# Patient Record
Sex: Male | Born: 1980 | Race: White | Hispanic: No | Marital: Married | Smoking: Never smoker
Health system: Southern US, Community
[De-identification: ages and names within clinical notes are randomized; demographics above are authoritative.]

## PROBLEM LIST (undated history)

## (undated) HISTORY — PX: HERNIA REPAIR: SHX51

---

## 2005-05-20 ENCOUNTER — Emergency Department (HOSPITAL_COMMUNITY): Admission: EM | Admit: 2005-05-20 | Discharge: 2005-05-20 | Payer: Self-pay | Admitting: Emergency Medicine

## 2009-02-14 ENCOUNTER — Emergency Department (HOSPITAL_COMMUNITY): Admission: EM | Admit: 2009-02-14 | Discharge: 2009-02-14 | Payer: Self-pay | Admitting: Family Medicine

## 2009-12-21 ENCOUNTER — Emergency Department (HOSPITAL_COMMUNITY): Admission: EM | Admit: 2009-12-21 | Discharge: 2009-12-21 | Payer: Self-pay | Admitting: Family Medicine

## 2010-11-30 ENCOUNTER — Emergency Department (HOSPITAL_COMMUNITY)
Admission: EM | Admit: 2010-11-30 | Discharge: 2010-11-30 | Disposition: A | Discharge status: Home / Self Care | Payer: Worker's Compensation | Attending: Emergency Medicine | Admitting: Emergency Medicine

## 2010-11-30 ENCOUNTER — Emergency Department (HOSPITAL_COMMUNITY): Payer: Worker's Compensation

## 2010-11-30 DIAGNOSIS — IMO0002 Reserved for concepts with insufficient information to code with codable children: Secondary | ICD-10-CM | POA: Insufficient documentation

## 2010-11-30 DIAGNOSIS — X58XXXA Exposure to other specified factors, initial encounter: Secondary | ICD-10-CM | POA: Insufficient documentation

## 2010-11-30 DIAGNOSIS — M25569 Pain in unspecified knee: Secondary | ICD-10-CM | POA: Insufficient documentation

## 2010-11-30 DIAGNOSIS — Y9302 Activity, running: Secondary | ICD-10-CM | POA: Insufficient documentation

## 2011-09-18 ENCOUNTER — Emergency Department (HOSPITAL_COMMUNITY): Payer: Worker's Compensation

## 2011-09-18 ENCOUNTER — Encounter (HOSPITAL_COMMUNITY): Payer: Self-pay | Admitting: Emergency Medicine

## 2011-09-18 ENCOUNTER — Emergency Department (HOSPITAL_COMMUNITY)
Admission: EM | Admit: 2011-09-18 | Discharge: 2011-09-18 | Disposition: A | Discharge status: Home / Self Care | Payer: Worker's Compensation | Attending: Emergency Medicine | Admitting: Emergency Medicine

## 2011-09-18 DIAGNOSIS — S93409A Sprain of unspecified ligament of unspecified ankle, initial encounter: Secondary | ICD-10-CM | POA: Insufficient documentation

## 2011-09-18 DIAGNOSIS — X58XXXA Exposure to other specified factors, initial encounter: Secondary | ICD-10-CM | POA: Insufficient documentation

## 2011-09-18 MED ORDER — IBUPROFEN 800 MG PO TABS
800.0000 mg | ORAL_TABLET | Freq: Once | ORAL | Status: AC
  Administered 2011-09-18: 800 mg via ORAL
  Filled 2011-09-18: qty 1

## 2011-09-18 MED ORDER — NAPROXEN 500 MG PO TABS: 500.0000 mg | ORAL_TABLET | Freq: Two times a day (BID) | ORAL | Status: AC
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
## 2011-09-18 NOTE — ED Provider Notes (Signed)
History     CSN: [id number]  Arrival date & time 09/18/11  0129   First MD Initiated Contact with Patient 09/18/11 0203      Chief Complaint  Patient presents with  . Ankle Pain     HPI  History provided by the patient. Patient is a 31 year old male with no significant past medical history who presents with complaints of right ankle injury. 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 Patient has been inflammatory since injury. Pain is worse with movements and walking. Patient has not used any treatment for his symptoms. Patient has no history of previous ankle injuries. Patient denies any numbness or weakness and foot. Symptoms are described as moderate. Patient denies any other aggravating or alleviating factors.   History reviewed. No pertinent past medical history.  History reviewed. No pertinent past surgical history.  No family history on file.  History  Substance Use Topics  . Smoking status: Never Smoker   . Smokeless tobacco: Not on file  . Alcohol Use: Yes      Review of Systems  Neurological: Negative for weakness and numbness.    Allergies  Review of patient's allergies indicates no known allergies.  Home Medications   Current Outpatient Rx  Name Route Sig Dispense Refill  . DIPHENHYDRAMINE-APAP (SLEEP) 25-500 MG PO TABS Oral Take 1 tablet by mouth at bedtime as needed. sleep    . SALINE NASAL SPRAY 0.65 % NA SOLN Nasal Place 2 sprays into the nose as needed. Nasal congestion      BP 155/104  Pulse 126  Temp 98.5 F (36.9 C)  Resp 24  SpO2 92%  Physical Exam  Nursing note and vitals reviewed. Constitutional: He is oriented to person, place, and time. He appears well-developed and well-nourished. No distress.  HENT:  Head: Normocephalic and atraumatic.  Cardiovascular: Normal rate and regular rhythm.   Pulmonary/Chest: Effort normal  and breath sounds normal.  Abdominal: Soft.  Musculoskeletal:       No significant edema of right ankle. Normal sensations in toes with normal pedal pulses. Full range of motion of ankle and foot. No pain over lateral and medial malleolus. No pain over proximal fifth metatarsal. Mild tenderness over anterior tibialis muscle and tendon. No deformities.  Neurological: He is alert and oriented to person, place, and time.  Skin: Skin is warm.  Psychiatric: His behavior is normal.    ED Course  Procedures   Dg Ankle Complete Right  09/18/2011  *RADIOLOGY REPORT*  Clinical Data: Marthe Patch pop after tripping over curb; right lateral ankle pain.  RIGHT ANKLE - COMPLETE 3+ VIEW  Comparison: None.  Findings: There is no evidence of fracture or dislocation.  The ankle mortise is intact; the interosseous space is within normal limits.  No talar tilt or subluxation is seen.  The joint spaces are preserved.  No significant soft tissue abnormalities are seen.  IMPRESSION: No evidence of fracture or dislocation.  Original Report Authenticated By: Tonia Ghent, M.D.     1. Ankle sprain       MDM  Pt seen and evaluated.  Pt in no acute distress.  X-rays reviewed with no signs for fx.  Will provide ASO.  Pt reports has crutches at home.        Angus Seller, [place] 09/19/11 1535

## 2011-09-18 NOTE — ED Notes (Signed)
Patient transported to X-ray 

## 2011-09-18 NOTE — Discharge Instructions (Signed)
Your x-rays today do not show any broken bones in the ankle. At this time your providers feel your symptoms are caused from a sprain. Use rest, ice, compression and elevation to help with your symptoms. It is recommended that you rest your foot and ankle for the next 3-5 days and followup with an occupational therapists.   Ankle Sprain An ankle sprain is an injury to the strong, fibrous tissues (ligaments) that hold the bones of your ankle joint together.  CAUSES Ankle sprain usually is caused by a fall or by twisting your ankle. People who participate in sports are more prone to these types of injuries.  SYMPTOMS  Symptoms of ankle sprain include:  Pain in your ankle. The pain may be present at rest or only when you are trying to stand or walk.   Swelling.   Bruising. Bruising may develop immediately or within 1 to 2 days after your injury.   Difficulty standing or walking.  DIAGNOSIS  Your caregiver will ask you details about your injury and perform a physical exam of your ankle to determine if you have an ankle sprain. During the physical exam, your caregiver will press and squeeze specific areas of your foot and ankle. Your caregiver will try to move your ankle in certain ways. An X-ray exam may be done to be sure a bone was not broken or a ligament did not separate from one of the bones in your ankle (avulsion).  TREATMENT  Certain types of braces can help stabilize your ankle. Your caregiver can make a recommendation for this. Your caregiver may recommend the use of medication for pain. If your sprain is severe, your caregiver may refer you to a surgeon who helps to restore function to parts of your skeletal system (orthopedist) or a physical therapist. HOME CARE INSTRUCTIONS  Apply ice to your injury for 1 to 2 days or as directed by your caregiver. Applying ice helps to reduce inflammation and pain.  Put ice in a plastic bag.   Place a towel between your skin and the bag.   Leave  the ice on for 15 to 20 minutes at a time, every 2 hours while you are awake.   Take over-the-counter or prescription medicines for pain, discomfort, or fever only as directed by your caregiver.   Keep your injured leg elevated, when possible, to lessen swelling.   If your caregiver recommends crutches, use them as instructed. Gradually, put weight on the affected ankle. Continue to use crutches or a cane until you can walk without feeling pain in your ankle.   If you have a plaster splint, wear the splint as directed by your caregiver. Do not rest it on anything harder than a pillow the first 24 hours. Do not put weight on it. Do not get it wet. You may take it off to take a shower or bath.   You may have been given an elastic bandage to wear around your ankle to provide support. If the elastic bandage is too tight (you have numbness or tingling in your foot or your foot becomes cold and blue), adjust the bandage to make it comfortable.   If you have an air splint, you may blow more air into it or let air out to make it more comfortable. You may take your splint off at night and before taking a shower or bath.   Wiggle your toes in the splint several times per day if you are able.  SEEK MEDICAL CARE  IF:   You have an increase in bruising, swelling, or pain.   Your toes feel cold.   Pain relief is not achieved with medication.  SEEK IMMEDIATE MEDICAL CARE IF: Your toes are numb or blue or you have severe pain. MAKE SURE YOU:   Understand these instructions.   Will watch your condition.   Will get help right away if you are not doing well or get worse.  Document Released: 06/10/2005 Document Revised: 05/30/2011 Document Reviewed: 01/13/2008 [facility] Patient Information 2012 [place], [place].   RESOURCE GUIDE  Dental Problems  Patients with Medicaid: [facility] [phone] [address]                                            [phone] [address] Phone:  [phone]                                                  Phone:  [phone]  If unable to pay or uninsured, contact:  [facility] or [facility] to become qualified for the adult dental clinic.  Chronic Pain Problems Contact [facility]  [phone] Patients need to be referred by their primary care doctor.  Insufficient Money for Medicine Contact United Way:  call "211" or [facility] [phone].  No Primary Care Doctor Call [facility]  [phone] Other agencies that provide inexpensive medical care    [facility]  [phone]    [facility]  [phone]    [facility]  [phone]    [facility]  [phone]    [facility]  [phone]    [facility]  [phone]  Psychological Services [facility] Behavioral Health  [phone] [facility]  [phone] [facility]   [phone] (emergency services [phone])  Substance Abuse Resources [facility]  986-850-4981 [facility] 838-278-1128 [facility] [phone] Floydene Flock ([phone] Residential & Outpatient Substance Abuse Program  6414960436  Abuse/Neglect [facility] Child Abuse Hotline 571-733-7973 [facility] Child Abuse Hotline 501-055-8009 (After Hours)  Emergency Shel[facility] (559) [phone]  M[facility] [place] [facility] 628-495-7651 [facility] [phone]  MRSA Hotline #:   (705)534-8603    [facility] Resources  [facility]     [employer]                          [facility] Dept. [address] [place]                       [address]      [address]  Patrecia Pace  [facility] Phone:  [phone]                                   Phone:   [phone]                 Phone:  [phone]  [facility] Mental Health Phone:  [phone]  [facility] Child Abuse Hotline 579-247-2234 [phone])[phone] (After Hours)

## 2011-09-26 NOTE — ED Provider Notes (Signed)
Medical screening examination/treatment/procedure(s) were performed by non-physician practitioner and as supervising physician I was immediately available for consultation/collaboration.   Gavin Pound. Joelle Roswell, MD 09/26/11 1039

## 2014-04-14 ENCOUNTER — Emergency Department (HOSPITAL_COMMUNITY)
Admission: EM | Admit: 2014-04-14 | Discharge: 2014-04-14 | Disposition: A | Discharge status: Home / Self Care | Payer: Worker's Compensation | Attending: Emergency Medicine | Admitting: Emergency Medicine

## 2014-04-14 ENCOUNTER — Encounter (HOSPITAL_COMMUNITY): Payer: Self-pay | Admitting: Emergency Medicine

## 2014-04-14 DIAGNOSIS — Y9389 Activity, other specified: Secondary | ICD-10-CM | POA: Insufficient documentation

## 2014-04-14 DIAGNOSIS — X58XXXA Exposure to other specified factors, initial encounter: Secondary | ICD-10-CM | POA: Insufficient documentation

## 2014-04-14 DIAGNOSIS — S199XXA Unspecified injury of neck, initial encounter: Secondary | ICD-10-CM | POA: Insufficient documentation

## 2014-04-14 DIAGNOSIS — Y92810 Car as the place of occurrence of the external cause: Secondary | ICD-10-CM | POA: Insufficient documentation

## 2014-04-14 DIAGNOSIS — M62838 Other muscle spasm: Secondary | ICD-10-CM

## 2014-04-14 MED ORDER — DIAZEPAM 5 MG/ML IJ SOLN
5.0000 mg | Freq: Once | INTRAMUSCULAR | Status: AC
  Administered 2014-04-14: 5 mg via INTRAMUSCULAR
  Filled 2014-04-14: qty 2

## 2014-04-14 MED ORDER — IBUPROFEN 800 MG PO TABS: 800.0000 mg | ORAL_TABLET | Freq: Three times a day (TID) | ORAL | Status: DC

## 2014-04-14 MED ORDER — DIAZEPAM 5 MG PO TABS: 5.0000 mg | ORAL_TABLET | Freq: Two times a day (BID) | ORAL | Status: AC | PRN

## 2014-04-14 MED ORDER — KETOROLAC TROMETHAMINE 60 MG/2ML IM SOLN
60.0000 mg | Freq: Once | INTRAMUSCULAR | Status: AC
  Administered 2014-04-14: 60 mg via INTRAMUSCULAR
  Filled 2014-04-14: qty 2

## 2014-04-14 NOTE — ED Notes (Signed)
Declined W/C at D/C and was escorted to lobby by RN. 

## 2014-04-14 NOTE — ED Notes (Signed)
Pt was attempting to get into vehicle while carrying a case and he felt something pop on the L posterior aspect of neck.  Pt now states difficulty turning head to R and tightness to L scapula.  Denies numbness, tingling to extremites.
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
## 2014-04-14 NOTE — Discharge Instructions (Signed)

## 2014-04-19 NOTE — ED Provider Notes (Signed)
Medical screening examination/treatment/procedure(s) were performed by non-physician practitioner and as supervising physician I was immediately available for consultation/collaboration.   EKG Interpretation None        Matthew Gentry, MD 04/19/14 0102 

## 2015-10-26 ENCOUNTER — Encounter (HOSPITAL_COMMUNITY): Payer: Self-pay | Admitting: Emergency Medicine

## 2015-10-26 ENCOUNTER — Emergency Department (HOSPITAL_COMMUNITY): Payer: Worker's Compensation

## 2015-10-26 ENCOUNTER — Emergency Department (HOSPITAL_COMMUNITY)
Admission: EM | Admit: 2015-10-26 | Discharge: 2015-10-26 | Disposition: A | Discharge status: Home / Self Care | Payer: Worker's Compensation | Attending: Emergency Medicine | Admitting: Emergency Medicine

## 2015-10-26 DIAGNOSIS — S8391XA Sprain of unspecified site of right knee, initial encounter: Secondary | ICD-10-CM | POA: Diagnosis not present

## 2015-10-26 DIAGNOSIS — Y939 Activity, unspecified: Secondary | ICD-10-CM | POA: Diagnosis not present

## 2015-10-26 DIAGNOSIS — Z791 Long term (current) use of non-steroidal anti-inflammatories (NSAID): Secondary | ICD-10-CM | POA: Diagnosis not present

## 2015-10-26 DIAGNOSIS — Y999 Unspecified external cause status: Secondary | ICD-10-CM | POA: Insufficient documentation

## 2015-10-26 DIAGNOSIS — Y9289 Other specified places as the place of occurrence of the external cause: Secondary | ICD-10-CM | POA: Diagnosis not present

## 2015-10-26 DIAGNOSIS — M25561 Pain in right knee: Secondary | ICD-10-CM | POA: Diagnosis present

## 2015-10-26 DIAGNOSIS — Z79899 Other long term (current) drug therapy: Secondary | ICD-10-CM | POA: Diagnosis not present
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
## 2015-10-26 NOTE — Discharge Instructions (Signed)
Please read and follow all provided instructions.  Your diagnoses today include:  1. Knee sprain, right, initial encounter    Tests performed today include:  Vital signs. See below for your results today.   Medications prescribed:   None   Home care instructions:  Follow any educational materials contained in this packet.  Follow-up instructions: Please follow-up with your primary care provider for further evaluation of symptoms and treatment   Return instructions:   Please return to the Emergency Department if you do not get better, if you get worse, or new symptoms OR  - Fever (temperature greater than 101.55F)  - Bleeding that does not stop with holding pressure to the area    -Severe pain (please note that you may be more sore the day after your accident)  - Chest Pain  - Difficulty breathing  - Severe nausea or vomiting  - Inability to tolerate food and liquids  - Passing out  - Skin becoming red around your wounds  - Change in mental status (confusion or lethargy)  - New numbness or weakness     Please return if you have any other emergent concerns.  Additional Information:  Your vital signs today were: BP 172/112 mmHg   Pulse 110   Temp(Src) 98.2 F (36.8 C) (Oral)   Resp 18   SpO2 98% If your blood pressure (BP) was elevated above 135/85 this visit, please have this repeated by your doctor within one month. ---------------
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
## 2017-12-06 ENCOUNTER — Emergency Department (HOSPITAL_COMMUNITY)
Admission: EM | Admit: 2017-12-06 | Discharge: 2017-12-06 | Disposition: A | Discharge status: Home / Self Care | Payer: 59 | Attending: Emergency Medicine | Admitting: Emergency Medicine

## 2017-12-06 ENCOUNTER — Encounter (HOSPITAL_COMMUNITY): Payer: Self-pay | Admitting: Emergency Medicine

## 2017-12-06 DIAGNOSIS — Y929 Unspecified place or not applicable: Secondary | ICD-10-CM | POA: Insufficient documentation

## 2017-12-06 DIAGNOSIS — S61211A Laceration without foreign body of left index finger without damage to nail, initial encounter: Secondary | ICD-10-CM | POA: Diagnosis not present

## 2017-12-06 DIAGNOSIS — Z23 Encounter for immunization: Secondary | ICD-10-CM | POA: Diagnosis not present

## 2017-12-06 DIAGNOSIS — Y999 Unspecified external cause status: Secondary | ICD-10-CM | POA: Diagnosis not present

## 2017-12-06 DIAGNOSIS — W260XXA Contact with knife, initial encounter: Secondary | ICD-10-CM | POA: Insufficient documentation

## 2017-12-06 DIAGNOSIS — S6992XA Unspecified injury of left wrist, hand and finger(s), initial encounter: Secondary | ICD-10-CM | POA: Diagnosis present

## 2017-12-06 DIAGNOSIS — Y93A9 Activity, other involving cardiorespiratory exercise: Secondary | ICD-10-CM | POA: Insufficient documentation

## 2017-12-06 MED ORDER — TETANUS-DIPHTH-ACELL PERTUSSIS 5-2.5-18.5 LF-MCG/0.5 IM SUSP
0.5000 mL | Freq: Once | INTRAMUSCULAR | Status: AC
  Administered 2017-12-06: 0.5 mL via INTRAMUSCULAR
  Filled 2017-12-06: qty 0.5

## 2017-12-06 MED ORDER — LIDOCAINE HCL (PF) 1 % IJ SOLN
5.0000 mL | Freq: Once | INTRAMUSCULAR | Status: AC
  Administered 2017-12-06: 5 mL
  Filled 2017-12-06: qty 5

## 2017-12-06 NOTE — ED Provider Notes (Signed)
[facility] EM[facility]GENC[facility] Provider Note   CSN: [id number] Arrival date & time: 12/06/17  [id number]     History   Chief Complaint Chief Complaint  Patient presents with  . Finger Laceration    HPI Chad Barnes is a 37 y.o. male.  The history is provided by the patient.  Laceration   The incident occurred 1 to 2 hours ago. Pain location: left index finger. The laceration is 2 cm in size. The laceration mechanism was a a clean knife (pulled out his pocket knife and accidentally cut his finger). The pain is at a severity of 1/10. The pain is mild. The pain has been constant since onset. He reports no foreign bodies present. His tetanus status is out of date.    History reviewed. No pertinent past medical history.  There are no active problems to display for this patient.   Past Surgical History:  Procedure Laterality Date  . HERNIA REPAIR          Home Medications    Prior to Admission medications   Medication Sig Start Date End Date Taking? Authorizing Provider  diazepam (VALIUM) 5 MG tablet Take 1 tablet (5 mg total) by mouth every 12 (twelve) hours as needed for anxiety. Patient not taking: Reported on 10/26/2015 04/14/14   Roxy HorsemanBrowning, Robert, PA-C  diphenhydramine-acetaminophen (TYLENOL PM EXTRA STRENGTH) 25-500 MG TABS Take 2 tablets by mouth at bedtime as needed (for sleep).     [provider]    Family History No family history on file.  Social History Social History   Tobacco Use  . Smoking status: Never Smoker  Substance Use Topics  . Alcohol use: Yes    Comment: occ  . Drug use: No     Allergies   Patient has no known allergies.   Review of Systems Review of Systems  All other systems reviewed and are negative.    Physical Exam Updated Vital Signs BP (!) 151/101 (BP Location: Right Arm)   Pulse 79   Temp 98.2 F (36.8 C) (Oral)   Resp 17   Ht 5\' 11"  (1.803 m)   Wt 99.8 kg (220 lb)   SpO2 98%   BMI 30.68  kg/m   Physical Exam  Constitutional: He is oriented to person, place, and time. He appears well-developed and well-nourished. No distress.  HENT:  Head: Normocephalic and atraumatic.  Eyes: Pupils are equal, round, and reactive to light. EOM are normal.  Cardiovascular: Normal rate.  Pulmonary/Chest: Effort normal.  Musculoskeletal: He exhibits tenderness.       Left hand: He exhibits tenderness and laceration. He exhibits normal range of motion. Normal sensation noted. Normal strength noted. He exhibits no finger abduction and no thumb/finger opposition.       Hands: Neurological: He is alert and oriented to person, place, and time.  Skin: Skin is warm and dry. Capillary refill takes less than 2 seconds.  Psychiatric: He has a normal mood and affect. His behavior is normal.  Nursing note and vitals reviewed.    ED Treatments / Results  Labs (all labs ordered are listed, but only abnormal results are displayed) Labs Reviewed - No data to display  EKG None  Radiology No results found.  Procedures Procedures (including critical care time) LACERATION REPAIR Performed by: [employer] RxWhitney Kyler Lerette Authorized by: Gwyneth SproutWhitney Zoelle Markus Consent: Verbal consent obtained. Risks and benefits: risks, benefits and alternatives were discussed Consent given by: patient Patient identity confirmed: provided demographic data Prepped and Draped in  normal sterile fashion Wound explored  Laceration Location: left index finger  Laceration Length: 2cm  No Foreign Bodies seen or palpated  Anesthesia: local infiltration  Local anesthetic: lidocaine 1% without epinephrine  Anesthetic total: 3 ml  Irrigation method: syringe Amount of cleaning: standard  Skin closure: 4.0 vicryl  Number of sutures: 3  Technique: simple interrupted  Patient tolerance: Patient tolerated the procedure well with no immediate complications.   Medications Ordered in ED Medications  lidocaine (PF) (XYLOCAINE) 1  % injection 5 mL (has no administration in time range)  Tdap (BOOSTRIX) injection 0.5 mL (has no administration in time range)     Initial Impression / Assessment and Plan / ED Course  I have reviewed the triage vital signs and the nursing notes.  Pertinent labs & imaging results that were available during my care of the patient were reviewed by me and considered in my medical decision making (see chart for details).    Patient with an accidental laceration to the left index finger without evidence of tendon injury.  Wound repaired as above and tetanus shot updated.   Final Clinical Impressions(s) / ED Diagnoses   Final diagnoses:  Laceration of left index finger without foreign body without damage to nail, initial encounter    ED Discharge Orders    None       Gwyneth Sprout, MD 12/06/17 [phone]

## 2017-12-06 NOTE — Discharge Instructions (Signed)
The stitches should dissolve in 5 to 7 days.  Just pull on them and when they are ready to come out you should be able to pull them out.  The stitches can get wet but do not scrub them or soak them underwater.

## 2018-05-30 ENCOUNTER — Encounter (HOSPITAL_COMMUNITY): Payer: Self-pay | Admitting: Emergency Medicine

## 2018-05-30 ENCOUNTER — Emergency Department (HOSPITAL_COMMUNITY)
Admission: EM | Admit: 2018-05-30 | Discharge: 2018-05-30 | Disposition: A | Discharge status: Home / Self Care | Payer: No Typology Code available for payment source | Attending: Emergency Medicine | Admitting: Emergency Medicine

## 2018-05-30 DIAGNOSIS — Z7721 Contact with and (suspected) exposure to potentially hazardous body fluids: Secondary | ICD-10-CM | POA: Diagnosis present

## 2018-05-30 LAB — RAPID HIV SCREEN (HIV 1/2 AB+AG)
HIV 1/2 Antibodies: NONREACTIVE
HIV-1 P24 Antigen - HIV24: NONREACTIVE
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
## 2018-05-30 NOTE — Discharge Instructions (Signed)
You have an exposure panel pending. You will be contacted if there is concern for harmful exposure or for any abnormal lab results. Otherwise, continue regular first aid practices for management of your wound. Avoid using hydrogen peroxide for cleaning; mild soap and warm water will suffice.

## 2018-05-31 LAB — HEPATITIS PANEL, ACUTE
HCV Ab: <0.1 s/co ratio (ref 0.0–0.9)
Hep A IgM: NEGATIVE
Hep B C IgM: NEGATIVE
Hepatitis B Surface Ag: NEGATIVE
